# Patient Record
Sex: Female | Born: 1965 | Race: White | Hispanic: No | Marital: Married | Smoking: Never smoker
Health system: Southern US, Community
[De-identification: ages and names within clinical notes are randomized; demographics above are authoritative.]

## PROBLEM LIST (undated history)

## (undated) DIAGNOSIS — E7800 Pure hypercholesterolemia, unspecified: Secondary | ICD-10-CM

## (undated) DIAGNOSIS — G589 Mononeuropathy, unspecified: Secondary | ICD-10-CM

## (undated) DIAGNOSIS — I10 Essential (primary) hypertension: Secondary | ICD-10-CM

## (undated) HISTORY — DX: Mononeuropathy, unspecified: G58.9

## (undated) HISTORY — DX: Essential (primary) hypertension: I10

## (undated) HISTORY — PX: NO PAST SURGERIES: SHX2092

## (undated) HISTORY — DX: Pure hypercholesterolemia, unspecified: E78.00

---

## 1998-06-09 ENCOUNTER — Ambulatory Visit (HOSPITAL_COMMUNITY): Admission: RE | Admit: 1998-06-09 | Discharge: 1998-06-09 | Payer: Self-pay | Admitting: Obstetrics and Gynecology

## 1998-09-09 ENCOUNTER — Inpatient Hospital Stay (HOSPITAL_COMMUNITY): Admission: AD | Admit: 1998-09-09 | Discharge: 1998-09-11 | Payer: Self-pay | Admitting: Obstetrics and Gynecology

## 1998-09-15 ENCOUNTER — Encounter (HOSPITAL_COMMUNITY): Admission: RE | Admit: 1998-09-15 | Discharge: 1998-12-14 | Payer: Self-pay | Admitting: Obstetrics and Gynecology

## 1998-09-18 ENCOUNTER — Inpatient Hospital Stay (HOSPITAL_COMMUNITY): Admission: AD | Admit: 1998-09-18 | Discharge: 1998-09-18 | Payer: Self-pay | Admitting: Obstetrics and Gynecology

## 1999-01-25 ENCOUNTER — Other Ambulatory Visit: Admission: RE | Admit: 1999-01-25 | Discharge: 1999-01-25 | Payer: Self-pay | Admitting: Obstetrics and Gynecology

## 2000-05-16 ENCOUNTER — Other Ambulatory Visit: Admission: RE | Admit: 2000-05-16 | Discharge: 2000-05-16 | Payer: Self-pay | Admitting: Obstetrics and Gynecology

## 2000-07-27 ENCOUNTER — Emergency Department (HOSPITAL_COMMUNITY): Admission: EM | Admit: 2000-07-27 | Discharge: 2000-07-27 | Payer: Self-pay | Admitting: Emergency Medicine

## 2000-07-28 ENCOUNTER — Encounter: Payer: Self-pay | Admitting: Emergency Medicine

## 2000-07-28 ENCOUNTER — Ambulatory Visit (HOSPITAL_COMMUNITY): Admission: RE | Admit: 2000-07-28 | Discharge: 2000-07-28 | Payer: Self-pay | Admitting: *Deleted

## 2001-07-04 ENCOUNTER — Other Ambulatory Visit: Admission: RE | Admit: 2001-07-04 | Discharge: 2001-07-04 | Payer: Self-pay | Admitting: Obstetrics and Gynecology

## 2002-02-04 ENCOUNTER — Ambulatory Visit (HOSPITAL_COMMUNITY): Admission: RE | Admit: 2002-02-04 | Discharge: 2002-02-04 | Payer: Self-pay | Admitting: *Deleted

## 2002-07-05 ENCOUNTER — Other Ambulatory Visit: Admission: RE | Admit: 2002-07-05 | Discharge: 2002-07-05 | Payer: Self-pay | Admitting: Obstetrics and Gynecology

## 2003-07-07 ENCOUNTER — Other Ambulatory Visit: Admission: RE | Admit: 2003-07-07 | Discharge: 2003-07-07 | Payer: Self-pay | Admitting: Obstetrics and Gynecology

## 2004-05-08 ENCOUNTER — Ambulatory Visit (HOSPITAL_COMMUNITY): Admission: RE | Admit: 2004-05-08 | Discharge: 2004-05-08 | Payer: Self-pay | Admitting: Family Medicine

## 2004-06-11 ENCOUNTER — Ambulatory Visit (HOSPITAL_COMMUNITY): Admission: RE | Admit: 2004-06-11 | Discharge: 2004-06-11 | Payer: Self-pay | Admitting: *Deleted

## 2004-08-24 ENCOUNTER — Other Ambulatory Visit: Admission: RE | Admit: 2004-08-24 | Discharge: 2004-08-24 | Payer: Self-pay | Admitting: Obstetrics and Gynecology

## 2005-02-25 ENCOUNTER — Encounter: Admission: RE | Admit: 2005-02-25 | Discharge: 2005-02-25 | Payer: Self-pay | Admitting: Obstetrics and Gynecology

## 2005-03-01 ENCOUNTER — Encounter: Admission: RE | Admit: 2005-03-01 | Discharge: 2005-05-30 | Payer: Self-pay | Admitting: Family Medicine

## 2006-03-01 ENCOUNTER — Encounter: Admission: RE | Admit: 2006-03-01 | Discharge: 2006-03-01 | Payer: Self-pay | Admitting: Obstetrics and Gynecology

## 2006-03-21 ENCOUNTER — Encounter: Admission: RE | Admit: 2006-03-21 | Discharge: 2006-03-21 | Payer: Self-pay | Admitting: Obstetrics and Gynecology

## 2006-04-26 IMAGING — CT CT CHEST W/ CM
2 series · 14 of 32 positions shown, 19 images · IV contrast (omnipaque)
Comparison: none

CLINICAL DATA: 38 year-old with chest pain and back pain.
 CT OF THE CHEST AND ABDOMEN WITH CONTRAST
 Helical CT examination of the chest and abdomen was performed after bolus infusion of a total of 453cc of Omnipaque 300 using aortic dissection protocol.
 CT CHEST
 The chest wall, soft tissues and bony structures are unremarkable.  No breast masses are seen.  Heart is normal in size.  No pericardial effusion.  No mediastinal or hilar adenopathy.  Aorta is normal in caliber and major branch vessels are normal.  No evidence for aortic dissection.  Esophagus appears grossly normal.  No pulmonary emboli are seen.
 Examination of the lung parenchyma demonstrate no acute pulmonary findings.  No significant bony abnormalities.  
 IMPRESSION
 Unremarkable CT chest.
 CT ABDOMEN
 The abdominal aorta is normal in caliber.  No atherosclerotic change and no dissection.  The liver, spleen, pancreas, adrenal glands and kidneys are normal in appearance.  No mesenteric or retroperitoneal masses or adenopathy.  Gallbladder appears normal.  No significant bony findings.
 Unremarkable CT of the pelvis.  Normal CT appearance of the aorta and major branch vessels.

[Series 2: chest 5.0 b30f · axial · 0.62mm/px · z∈[-196,+89]mm · 7 of 80 slices shown]
[im 8/80  soft-tissue]
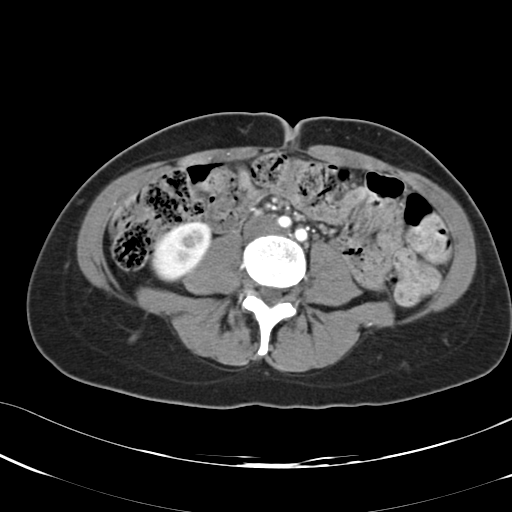
[im 15/80  soft-tissue]
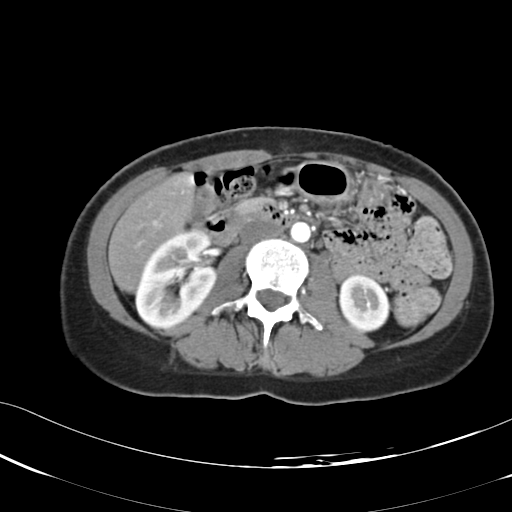
[im 26/80  soft-tissue]
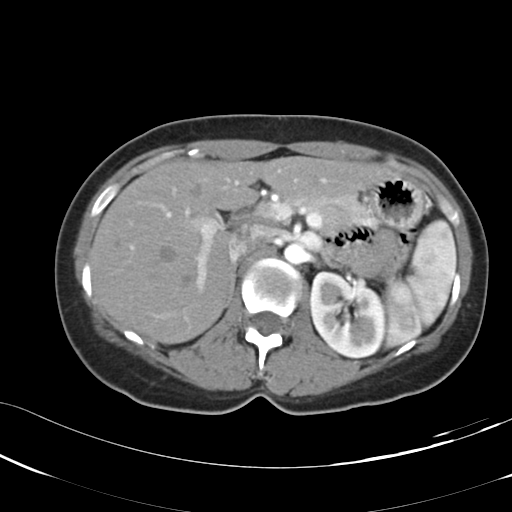
[im 36/80  soft-tissue]
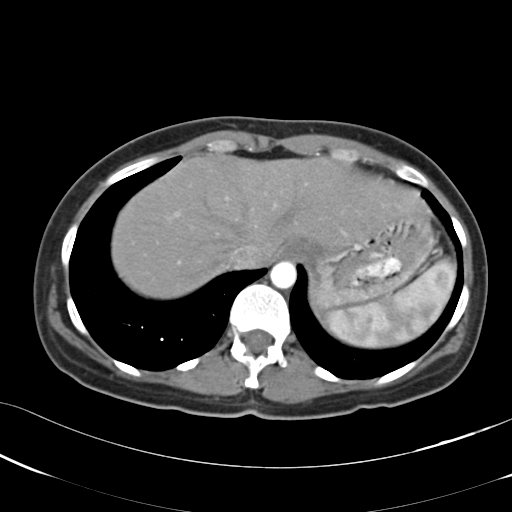
[im 44/80  soft-tissue]
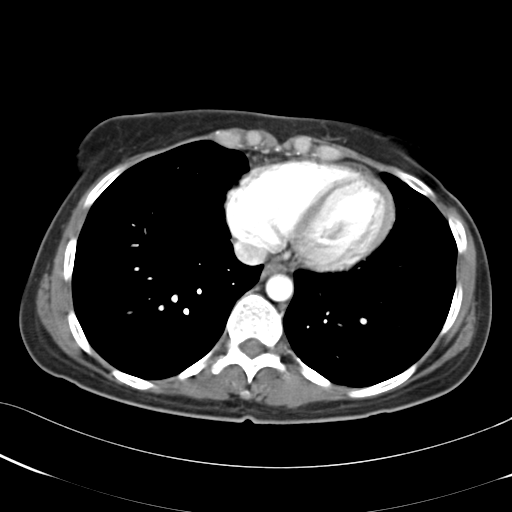
[im 54/80  soft-tissue]
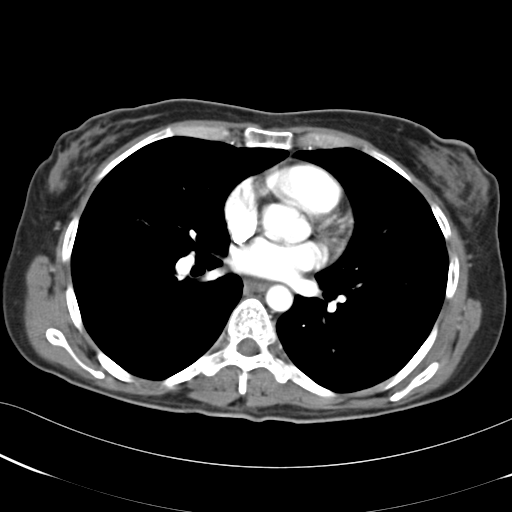
[im 65/80  soft-tissue]
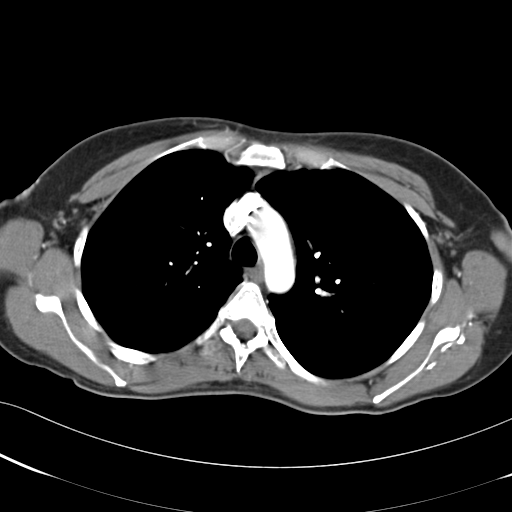

[Series 4: kidney del 5.0 b30f · axial · 0.51mm/px · z∈[-210,-90]mm · 7 of 33 slices shown, 12 images]
[im 5/33  soft-tissue]
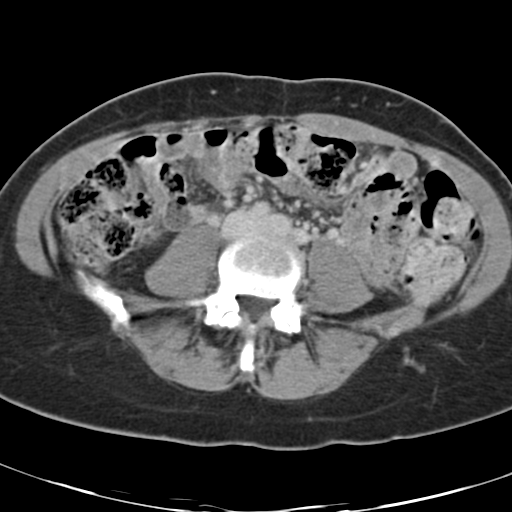
[im 5/33  bone]
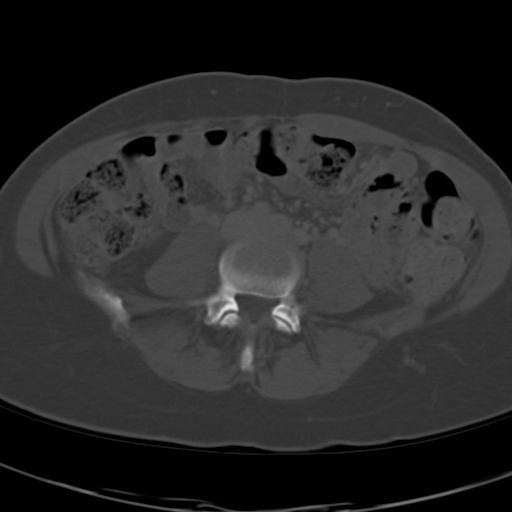
[im 9/33  soft-tissue]
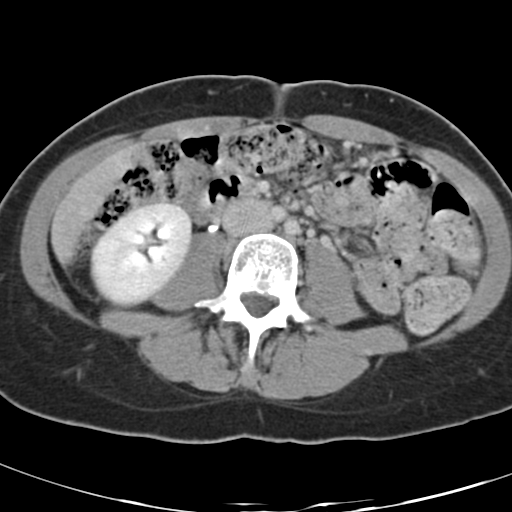
[im 13/33  soft-tissue]
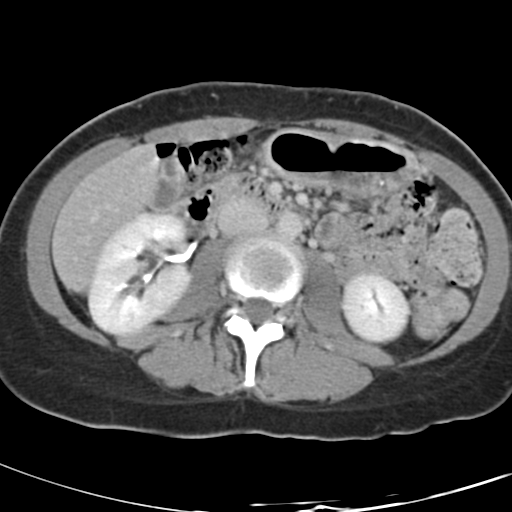
[im 17/33  soft-tissue]
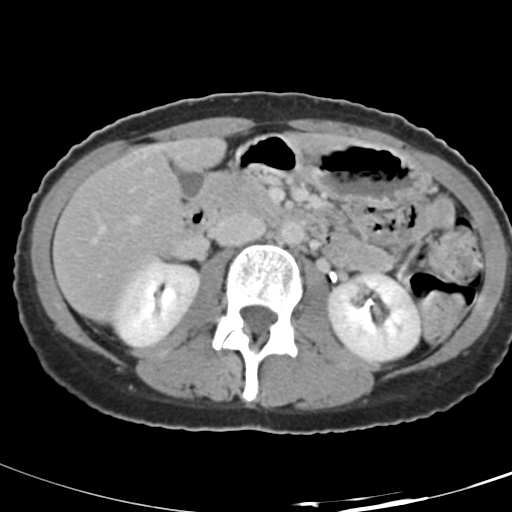
[im 17/33  lung]
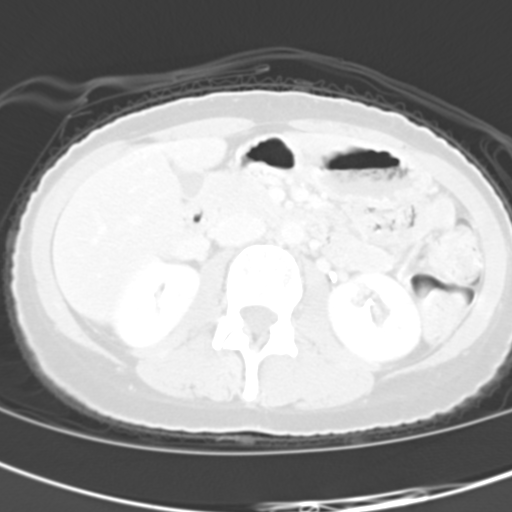
[im 21/33  soft-tissue]
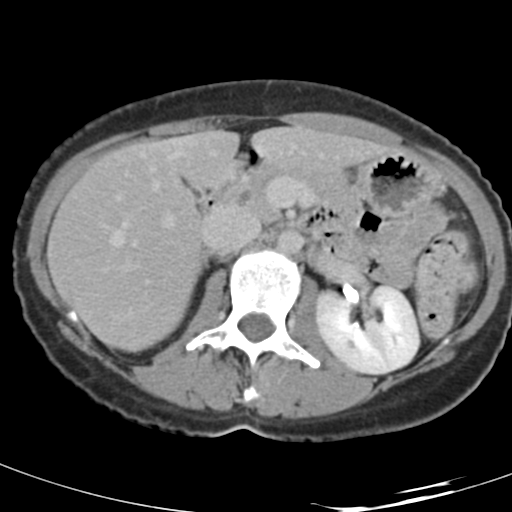
[im 21/33  lung]
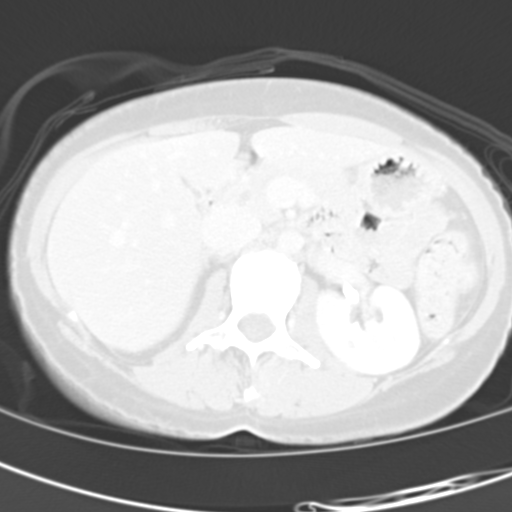
[im 25/33  soft-tissue]
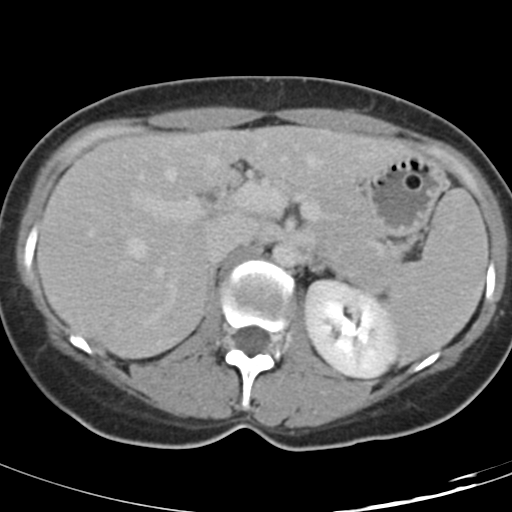
[im 25/33  lung]
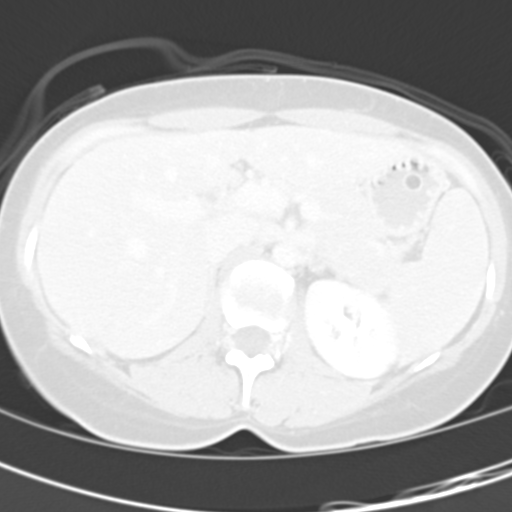
[im 29/33  soft-tissue]
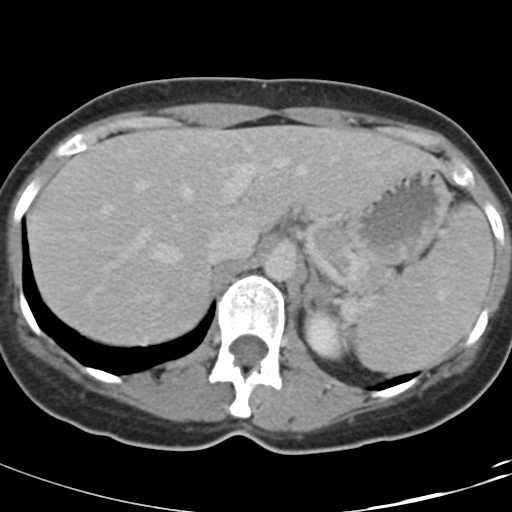
[im 29/33  lung]
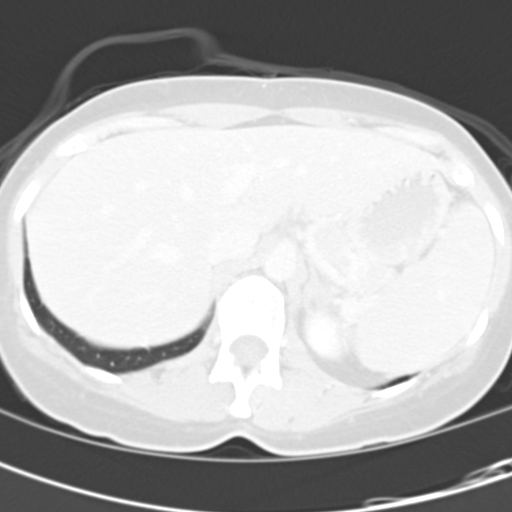

[14 of 32 positions shown; findings below may reference images not displayed]

## 2007-04-17 ENCOUNTER — Encounter: Admission: RE | Admit: 2007-04-17 | Discharge: 2007-04-17 | Payer: Self-pay | Admitting: Obstetrics and Gynecology

## 2008-04-18 ENCOUNTER — Encounter: Admission: RE | Admit: 2008-04-18 | Discharge: 2008-04-18 | Payer: Self-pay | Admitting: Obstetrics and Gynecology

## 2009-04-30 ENCOUNTER — Encounter: Admission: RE | Admit: 2009-04-30 | Discharge: 2009-04-30 | Payer: Self-pay | Admitting: Obstetrics and Gynecology

## 2010-05-10 ENCOUNTER — Encounter: Admission: RE | Admit: 2010-05-10 | Discharge: 2010-05-10 | Payer: Self-pay | Admitting: Obstetrics and Gynecology

## 2010-11-14 ENCOUNTER — Encounter: Payer: Self-pay | Admitting: Obstetrics and Gynecology

## 2011-05-05 ENCOUNTER — Other Ambulatory Visit: Payer: Self-pay | Admitting: Obstetrics and Gynecology

## 2011-05-05 DIAGNOSIS — Z1231 Encounter for screening mammogram for malignant neoplasm of breast: Secondary | ICD-10-CM

## 2011-05-12 ENCOUNTER — Ambulatory Visit
Admission: RE | Admit: 2011-05-12 | Discharge: 2011-05-12 | Disposition: A | Discharge status: Home / Self Care | Payer: BC Managed Care – PPO | Source: Ambulatory Visit | Attending: Obstetrics and Gynecology | Admitting: Obstetrics and Gynecology

## 2011-05-12 DIAGNOSIS — Z1231 Encounter for screening mammogram for malignant neoplasm of breast: Secondary | ICD-10-CM

## 2012-05-07 ENCOUNTER — Other Ambulatory Visit: Payer: Self-pay | Admitting: Obstetrics and Gynecology

## 2012-05-07 DIAGNOSIS — Z1231 Encounter for screening mammogram for malignant neoplasm of breast: Secondary | ICD-10-CM

## 2012-05-17 ENCOUNTER — Ambulatory Visit
Admission: RE | Admit: 2012-05-17 | Discharge: 2012-05-17 | Disposition: A | Discharge status: Home / Self Care | Payer: BC Managed Care – PPO | Source: Ambulatory Visit | Attending: Obstetrics and Gynecology | Admitting: Obstetrics and Gynecology

## 2012-05-17 DIAGNOSIS — Z1231 Encounter for screening mammogram for malignant neoplasm of breast: Secondary | ICD-10-CM

## 2013-02-15 ENCOUNTER — Other Ambulatory Visit: Payer: Self-pay | Admitting: Specialist

## 2013-02-15 DIAGNOSIS — M5412 Radiculopathy, cervical region: Secondary | ICD-10-CM

## 2013-02-21 ENCOUNTER — Ambulatory Visit
Admission: RE | Admit: 2013-02-21 | Discharge: 2013-02-21 | Disposition: A | Discharge status: Home / Self Care | Payer: BC Managed Care – PPO | Source: Ambulatory Visit | Attending: Specialist | Admitting: Specialist

## 2013-02-21 DIAGNOSIS — M5412 Radiculopathy, cervical region: Secondary | ICD-10-CM

## 2013-05-09 ENCOUNTER — Other Ambulatory Visit: Payer: Self-pay | Admitting: Specialist

## 2013-05-09 DIAGNOSIS — G5610 Other lesions of median nerve, unspecified upper limb: Secondary | ICD-10-CM

## 2013-05-09 DIAGNOSIS — S143XXA Injury of brachial plexus, initial encounter: Secondary | ICD-10-CM

## 2013-05-16 ENCOUNTER — Other Ambulatory Visit: Payer: BC Managed Care – PPO

## 2013-05-20 ENCOUNTER — Other Ambulatory Visit: Payer: BC Managed Care – PPO

## 2013-05-23 ENCOUNTER — Ambulatory Visit
Admission: RE | Admit: 2013-05-23 | Discharge: 2013-05-23 | Disposition: A | Discharge status: Home / Self Care | Payer: BC Managed Care – PPO | Source: Ambulatory Visit | Attending: Specialist | Admitting: Specialist

## 2013-05-23 DIAGNOSIS — S143XXA Injury of brachial plexus, initial encounter: Secondary | ICD-10-CM

## 2013-05-23 DIAGNOSIS — G5610 Other lesions of median nerve, unspecified upper limb: Secondary | ICD-10-CM

## 2013-05-24 ENCOUNTER — Other Ambulatory Visit: Payer: Self-pay

## 2013-05-24 DIAGNOSIS — Z1231 Encounter for screening mammogram for malignant neoplasm of breast: Secondary | ICD-10-CM

## 2013-06-11 ENCOUNTER — Ambulatory Visit
Admission: RE | Admit: 2013-06-11 | Discharge: 2013-06-11 | Disposition: A | Discharge status: Home / Self Care | Payer: BC Managed Care – PPO | Source: Ambulatory Visit

## 2013-06-11 DIAGNOSIS — Z1231 Encounter for screening mammogram for malignant neoplasm of breast: Secondary | ICD-10-CM

## 2014-07-09 ENCOUNTER — Other Ambulatory Visit: Payer: Self-pay

## 2014-07-09 DIAGNOSIS — Z1231 Encounter for screening mammogram for malignant neoplasm of breast: Secondary | ICD-10-CM

## 2014-07-16 ENCOUNTER — Ambulatory Visit: Payer: BC Managed Care – PPO

## 2014-07-23 ENCOUNTER — Ambulatory Visit
Admission: RE | Admit: 2014-07-23 | Discharge: 2014-07-23 | Disposition: A | Discharge status: Home / Self Care | Payer: BC Managed Care – PPO | Source: Ambulatory Visit

## 2014-07-23 DIAGNOSIS — Z1231 Encounter for screening mammogram for malignant neoplasm of breast: Secondary | ICD-10-CM

## 2015-08-10 ENCOUNTER — Other Ambulatory Visit: Payer: Self-pay

## 2015-08-10 DIAGNOSIS — Z1231 Encounter for screening mammogram for malignant neoplasm of breast: Secondary | ICD-10-CM

## 2015-09-07 ENCOUNTER — Ambulatory Visit
Admission: RE | Admit: 2015-09-07 | Discharge: 2015-09-07 | Disposition: A | Discharge status: Home / Self Care | Payer: BC Managed Care – PPO | Source: Ambulatory Visit

## 2015-09-07 DIAGNOSIS — Z1231 Encounter for screening mammogram for malignant neoplasm of breast: Secondary | ICD-10-CM

## 2016-01-06 ENCOUNTER — Encounter: Payer: Self-pay | Admitting: Neurology

## 2016-01-06 ENCOUNTER — Ambulatory Visit (INDEPENDENT_AMBULATORY_CARE_PROVIDER_SITE_OTHER): Payer: BC Managed Care – PPO | Admitting: Neurology

## 2016-01-06 VITALS — BP 137/82 | HR 64 | Ht 62.0 in | Wt 128.6 lb

## 2016-01-06 DIAGNOSIS — R4189 Other symptoms and signs involving cognitive functions and awareness: Secondary | ICD-10-CM | POA: Diagnosis not present

## 2016-01-06 DIAGNOSIS — R413 Other amnesia: Secondary | ICD-10-CM

## 2016-01-06 DIAGNOSIS — F05 Delirium due to known physiological condition: Secondary | ICD-10-CM

## 2016-01-06 DIAGNOSIS — I10 Essential (primary) hypertension: Secondary | ICD-10-CM | POA: Diagnosis not present

## 2016-01-06 DIAGNOSIS — E785 Hyperlipidemia, unspecified: Secondary | ICD-10-CM | POA: Diagnosis not present

## 2016-01-06 NOTE — Patient Instructions (Addendum)
Remember to drink plenty of fluid, eat healthy meals and do not skip any meals. Try to eat protein with a every meal and eat a healthy snack such as fruit or nuts in between meals. Try to keep a regular sleep-wake schedule and try to exercise daily, particularly in the form of walking, 20-30 minutes a day, if you can.   As far as your medications are concerned, I would like to suggest: None at this time  As far as diagnostic testing: MRI brain, labs, neurocognitive testing   Our phone number is [phone]. We also have an after hours call service for urgent matters and there is a physician on-call for urgent questions. For any emergencies you know to call 911 or go to the nearest emergency room

## 2016-01-06 NOTE — Progress Notes (Signed)
[facility]    Provider:  Dr Lucia Gaskins Referring Provider: Clayborn Heron, MD Primary Care Physician:  Clayborn Heron, MD  CC:  Cognitive changes  HPI:  Leslie Carter is a lovely 50 y.o. female here as a referral from Dr. Barbaraann Barthel for memory problems. No significant past medical history, HTN and HLD not on medications. She noticed it about a year ago. She has trouble remembering things she is supposed to do. If she doesn't write things down, they don't get done. No inciting events. She is a professor at [employer]. This past summer she was driving and didn't know where she was. She was reading a book with her daughter and started re-reading the book and didn't remember even reading it. Husband asks her questions and it takes her a while to process it. Doesn't feel foggy. She works on sleeping well and she can tell a difference. She gets 7 hours a night sleeps well. No snoring or abnormal movements. No altered mental status.No recent stressors, no mood disorders. Grandmother dies at 76 with dementia for 15 years. Mother in the last 6 months diagnosed with Alzheimers at 26 and changes started several years ago. No other focal neurologic deficits,no mood disorders.   Reviewed notes, labs and imaging from outside physicians, which showed: Reviewed pcp notes. Significant got: She has never smoked. No alcohol. TSH, cmp and cbc was drawn at pcp but no results in notes. Fluency 13 with "tiger" repeated and she was aware. Exam normal. Atypical chest pain in the past with extremity edema, echo with some thin density at the distal aorta with ct of the chest and abdomen and then mri of both all negative and no further wkup necessary. cardiolite with dr Mayford Knife 01/2007 with 80% EF.   Review of Systems: Patient complains of symptoms per HPI as well as the following symptoms: No CP, no SOB. Pertinent negatives per HPI. All others negative.   Social History   Social History  .  Marital Status: Married    Spouse Name: [name]  . Number of Children: 2  . Years of Education: 20   Occupational History  . [employer]    Social History Main Topics  . Smoking status: Never Smoker   . Smokeless tobacco: Not on file  . Alcohol Use: No  . Drug Use: No  . Sexual Activity: Not on file   Other Topics Concern  . Not on file   Social History Narrative      Lives with husband and daughter   Caffeine use: 1 cup coffee per day    Family History  Problem Relation Age of Onset  . Cancer Mother   . Cancer Paternal Grandfather   . Cancer Maternal Grandfather   . Dementia Mother   . Dementia Maternal Grandmother   . Hypertension Mother   . Hypertension Father     Past Medical History  Diagnosis Date  . Hypertension   . High cholesterol   . Pinched nerve     neck    Past Surgical History  Procedure Laterality Date  . No past surgeries      Current Outpatient Prescriptions  Medication Sig Dispense Refill  . aspirin 81 MG tablet Take 81 mg by mouth daily.    Marland Kitchen CALCIUM PO Take 800 mg by mouth daily.    Marland Kitchen VIVOTIF DR capsule Has  2 more capsules left  0   No current facility-administered medications for this visit.    Allergies as of  01/06/2016 - Review Complete 01/06/2016  Allergen Reaction Noted  . Codeine  01/06/2016    Vitals: BP 137/82 mmHg  Pulse 64  Ht 5\' 2"  (1.575 m)  Wt 128 lb 9.6 oz (58.333 kg)  BMI 23.52 kg/m2  LMP 06/03/2014 Last Weight:  Wt Readings from Last 1 Encounters:  01/06/16 128 lb 9.6 oz (58.333 kg)   Last Height:   Ht Readings from Last 1 Encounters:  01/06/16 5\' 2"  (1.575 m)    Physical exam: Exam: Gen: NAD, conversant, well nourised, well groomed                     CV: RRR, no MRG. No Carotid Bruits. No peripheral edema, warm, nontender Eyes: Conjunctivae clear without exudates or hemorrhage  Neuro: Detailed Neurologic Exam  Speech:    Speech is normal; fluent and spontaneous with normal comprehension.    Cognition:    The patient is oriented to person, place, and time;     recent and remote memory intact;     language fluent;     normal attention, concentration,     fund of knowledge  MMSE - Mini Mental State Exam 01/06/2016  Orientation to time 5  Orientation to Place 5  Registration 3  Attention/ Calculation 5  Recall 3  Language- name 2 objects 2  Language- repeat 1  Language- follow 3 step command 3  Language- read & follow direction 1  Write a sentence 1  Copy design 1  Total score 30   Cranial Nerves:    The pupils are equal, round, and reactive to light. The fundi are normal and spontaneous venous pulsations are present. Visual fields are full to finger confrontation. Extraocular movements are intact. Trigeminal sensation is intact and the muscles of mastication are normal. The face is symmetric. The palate elevates in the midline. Hearing intact. Voice is normal. Shoulder shrug is normal. The tongue has normal motion without fasciculations.   Coordination:    Normal finger to nose and heel to shin. Normal rapid alternating movements.   Gait:    Heel-toe and tandem gait are normal.   Motor Observation:    No asymmetry, no atrophy, and no involuntary movements noted. Tone:    Normal muscle tone.    Posture:    Posture is normal. normal erect    Strength:    Strength is V/V in the upper and lower limbs.      Sensation: intact to LT     Reflex Exam:  DTR's:    Deep tendon reflexes in the upper and lower extremities are normal bilaterally.   Toes:    The toes are downgoing bilaterally.   Clonus:    Clonus is absent.      Assessment/Plan:  47Lovely 50 year old with no significant PMHx, HTn and HLD not on medications, here for cognitive changes over the last year. MMSE 30/30.  MRI of the brain Labs: B12, rpr, hiv Discussed neurocognitive testing, she would like that.   Cc: Dr. Ardelia Memsankins  Ames Hoban, MD  [facility] [address] [place], [place] [id number]  Phone [phone] Fax [phone]

## 2016-01-07 ENCOUNTER — Telehealth: Payer: Self-pay | Admitting: *Deleted

## 2016-01-07 LAB — B12 AND FOLATE PANEL
Folate: 16.7 ng/mL (ref >3.0)
Vitamin B-12: 923 pg/mL (ref 211–946)

## 2016-01-07 LAB — RPR: RPR: NONREACTIVE

## 2016-01-07 LAB — HIV ANTIBODY (ROUTINE TESTING W REFLEX): HIV SCREEN 4TH GENERATION: NONREACTIVE

## 2016-01-07 NOTE — Telephone Encounter (Signed)
-----   Message from Anson FretAntonia B Ahern, MD sent at 01/07/2016  1:08 PM EDT ----- Labs normal thanks

## 2016-01-07 NOTE — Telephone Encounter (Signed)
LVM for pt to call about results. Gave [facility] phone number. OK to inform pt labs normal per Dr Lucia GaskinsAhern.

## 2016-01-11 NOTE — Telephone Encounter (Signed)
Pt returned call and was told labs were normal.

## 2016-01-27 ENCOUNTER — Ambulatory Visit
Admission: RE | Admit: 2016-01-27 | Discharge: 2016-01-27 | Disposition: A | Discharge status: Home / Self Care | Payer: BC Managed Care – PPO | Source: Ambulatory Visit | Attending: Neurology | Admitting: Neurology

## 2016-01-27 DIAGNOSIS — R4189 Other symptoms and signs involving cognitive functions and awareness: Secondary | ICD-10-CM | POA: Diagnosis not present

## 2016-01-27 DIAGNOSIS — F05 Delirium due to known physiological condition: Secondary | ICD-10-CM | POA: Diagnosis not present

## 2016-01-27 DIAGNOSIS — R413 Other amnesia: Secondary | ICD-10-CM

## 2016-01-27 MED ORDER — GADOBENATE DIMEGLUMINE 529 MG/ML IV SOLN
10.0000 mL | Freq: Once | INTRAVENOUS | Status: AC | PRN
  Administered 2016-01-27: 10 mL via INTRAVENOUS

## 2016-01-28 ENCOUNTER — Telehealth: Payer: Self-pay | Admitting: *Deleted

## 2016-01-28 NOTE — Telephone Encounter (Signed)
Called and spoke to pt about normal MRI for age per Dr Lucia GaskinsAhern. Pt verbalized understanding.

## 2016-01-28 NOTE — Telephone Encounter (Signed)
-----   Message from Anson FretAntonia B Ahern, MD sent at 01/28/2016  3:25 PM EDT ----- MRI normal for age thabks

## 2016-04-01 ENCOUNTER — Telehealth: Payer: Self-pay | Admitting: Psychology

## 2016-04-01 NOTE — Telephone Encounter (Signed)
Pt cx neuropsychology appt on 04-19-16. She said she is, "doing ok".

## 2016-04-19 ENCOUNTER — Ambulatory Visit: Payer: BC Managed Care – PPO | Admitting: Psychology

## 2016-11-01 ENCOUNTER — Other Ambulatory Visit: Payer: Self-pay | Admitting: Obstetrics and Gynecology

## 2016-11-01 DIAGNOSIS — Z1231 Encounter for screening mammogram for malignant neoplasm of breast: Secondary | ICD-10-CM

## 2016-11-25 ENCOUNTER — Ambulatory Visit
Admission: RE | Admit: 2016-11-25 | Discharge: 2016-11-25 | Disposition: A | Discharge status: Home / Self Care | Payer: BC Managed Care – PPO | Source: Ambulatory Visit | Attending: Obstetrics and Gynecology | Admitting: Obstetrics and Gynecology

## 2016-11-25 ENCOUNTER — Ambulatory Visit: Payer: BC Managed Care – PPO

## 2016-11-25 DIAGNOSIS — Z1231 Encounter for screening mammogram for malignant neoplasm of breast: Secondary | ICD-10-CM

## 2017-12-26 ENCOUNTER — Other Ambulatory Visit: Payer: Self-pay | Admitting: Obstetrics and Gynecology

## 2017-12-26 DIAGNOSIS — Z1231 Encounter for screening mammogram for malignant neoplasm of breast: Secondary | ICD-10-CM

## 2018-01-15 ENCOUNTER — Ambulatory Visit
Admission: RE | Admit: 2018-01-15 | Discharge: 2018-01-15 | Disposition: A | Discharge status: Home / Self Care | Payer: BC Managed Care – PPO | Source: Ambulatory Visit | Attending: Obstetrics and Gynecology | Admitting: Obstetrics and Gynecology

## 2018-01-15 DIAGNOSIS — Z1231 Encounter for screening mammogram for malignant neoplasm of breast: Secondary | ICD-10-CM

## 2019-03-20 ENCOUNTER — Other Ambulatory Visit: Payer: Self-pay | Admitting: Obstetrics and Gynecology

## 2019-03-20 DIAGNOSIS — Z1231 Encounter for screening mammogram for malignant neoplasm of breast: Secondary | ICD-10-CM

## 2019-05-09 ENCOUNTER — Ambulatory Visit: Payer: BC Managed Care – PPO

## 2019-08-05 ENCOUNTER — Other Ambulatory Visit: Payer: Self-pay

## 2019-08-05 ENCOUNTER — Ambulatory Visit
Admission: RE | Admit: 2019-08-05 | Discharge: 2019-08-05 | Disposition: A | Discharge status: Home / Self Care | Payer: BC Managed Care – PPO | Source: Ambulatory Visit | Attending: Obstetrics and Gynecology | Admitting: Obstetrics and Gynecology

## 2019-08-05 DIAGNOSIS — Z1231 Encounter for screening mammogram for malignant neoplasm of breast: Secondary | ICD-10-CM

## 2020-09-22 ENCOUNTER — Other Ambulatory Visit: Payer: Self-pay | Admitting: Obstetrics and Gynecology

## 2020-09-22 DIAGNOSIS — Z1231 Encounter for screening mammogram for malignant neoplasm of breast: Secondary | ICD-10-CM

## 2020-11-04 ENCOUNTER — Other Ambulatory Visit: Payer: Self-pay

## 2020-11-04 ENCOUNTER — Ambulatory Visit
Admission: RE | Admit: 2020-11-04 | Discharge: 2020-11-04 | Disposition: A | Discharge status: Home / Self Care | Payer: Self-pay | Source: Ambulatory Visit | Attending: Obstetrics and Gynecology | Admitting: Obstetrics and Gynecology

## 2020-11-04 DIAGNOSIS — Z1231 Encounter for screening mammogram for malignant neoplasm of breast: Secondary | ICD-10-CM

## 2022-07-26 ENCOUNTER — Other Ambulatory Visit: Payer: Self-pay | Admitting: Family Medicine

## 2022-07-26 DIAGNOSIS — Z780 Asymptomatic menopausal state: Secondary | ICD-10-CM

## 2022-07-26 DIAGNOSIS — Z8781 Personal history of (healed) traumatic fracture: Secondary | ICD-10-CM

## 2022-07-26 DIAGNOSIS — E559 Vitamin D deficiency, unspecified: Secondary | ICD-10-CM

## 2022-08-30 ENCOUNTER — Other Ambulatory Visit: Payer: BC Managed Care – PPO

## 2022-10-06 ENCOUNTER — Ambulatory Visit
Admission: RE | Admit: 2022-10-06 | Discharge: 2022-10-06 | Disposition: A | Discharge status: Home / Self Care | Payer: BC Managed Care – PPO | Source: Ambulatory Visit | Attending: Family Medicine | Admitting: Family Medicine

## 2022-10-06 DIAGNOSIS — Z8781 Personal history of (healed) traumatic fracture: Secondary | ICD-10-CM

## 2022-10-06 DIAGNOSIS — Z780 Asymptomatic menopausal state: Secondary | ICD-10-CM

## 2022-10-06 DIAGNOSIS — E559 Vitamin D deficiency, unspecified: Secondary | ICD-10-CM

## 2023-03-14 ENCOUNTER — Other Ambulatory Visit: Payer: Self-pay | Admitting: Obstetrics and Gynecology

## 2023-03-14 DIAGNOSIS — Z1231 Encounter for screening mammogram for malignant neoplasm of breast: Secondary | ICD-10-CM

## 2023-03-16 ENCOUNTER — Ambulatory Visit
Admission: RE | Admit: 2023-03-16 | Discharge: 2023-03-16 | Disposition: A | Discharge status: Home / Self Care | Payer: BC Managed Care – PPO | Source: Ambulatory Visit | Attending: Obstetrics and Gynecology | Admitting: Obstetrics and Gynecology

## 2023-03-16 DIAGNOSIS — Z1231 Encounter for screening mammogram for malignant neoplasm of breast: Secondary | ICD-10-CM

## 2024-03-08 ENCOUNTER — Other Ambulatory Visit: Payer: Self-pay | Admitting: Obstetrics and Gynecology

## 2024-03-08 DIAGNOSIS — Z1231 Encounter for screening mammogram for malignant neoplasm of breast: Secondary | ICD-10-CM

## 2024-03-19 ENCOUNTER — Ambulatory Visit
Admission: RE | Admit: 2024-03-19 | Discharge: 2024-03-19 | Disposition: A | Discharge status: Home / Self Care | Payer: Self-pay | Source: Ambulatory Visit | Attending: Obstetrics and Gynecology | Admitting: Obstetrics and Gynecology

## 2024-03-19 DIAGNOSIS — Z1231 Encounter for screening mammogram for malignant neoplasm of breast: Secondary | ICD-10-CM
